# Patient Record
Sex: Male | Born: 2000 | Hispanic: No | Marital: Single
Health system: Southern US, Community
[De-identification: ages and names within clinical notes are randomized; demographics above are authoritative.]

---
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
## 2019-08-16 IMAGING — CR DG ELBOW COMPLETE 3+V*L*
4 series · 4 of 4 positions shown · non-contrast
Comparison: None.

CLINICAL DATA: Skateboard injury with laceration.

EXAM:
LEFT ELBOW - COMPLETE 3+ VIEW

[x elbow ap left]
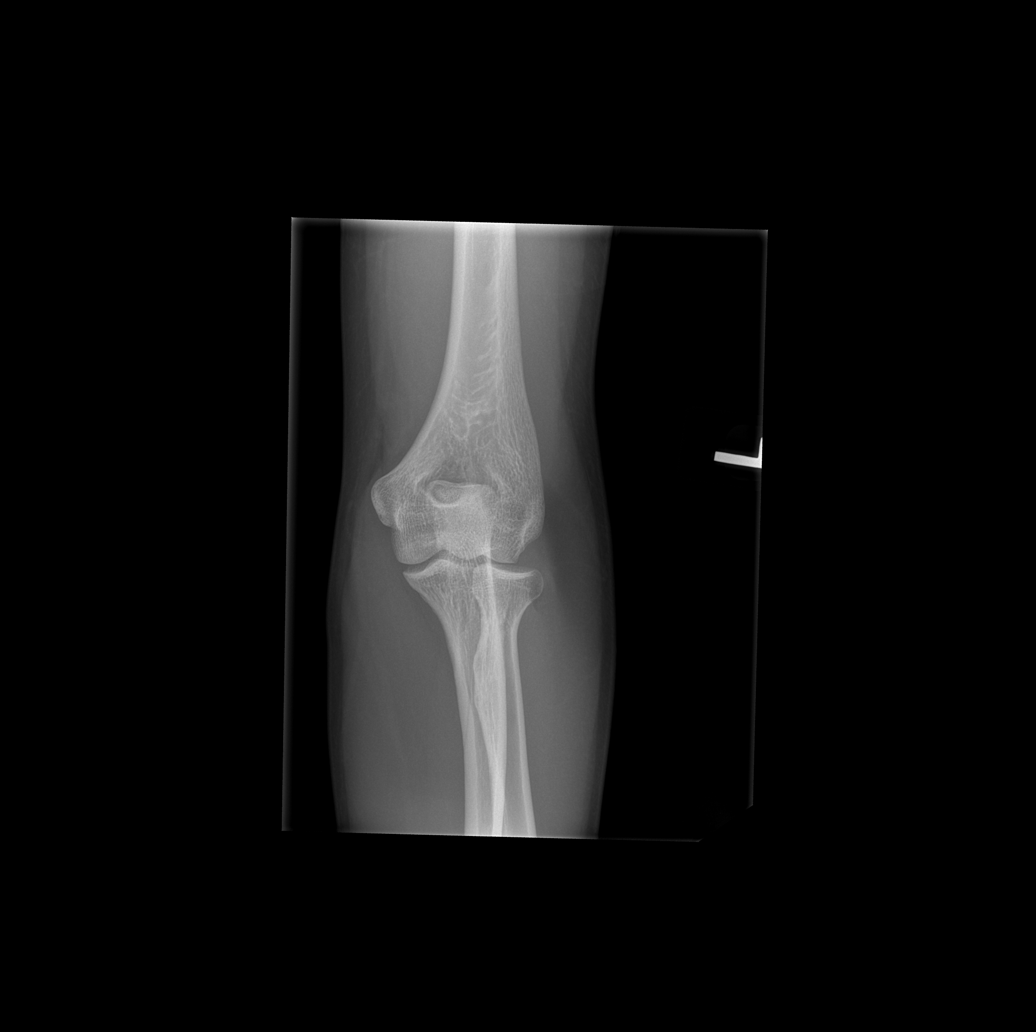

[x elbow obl left (1 of 2)]
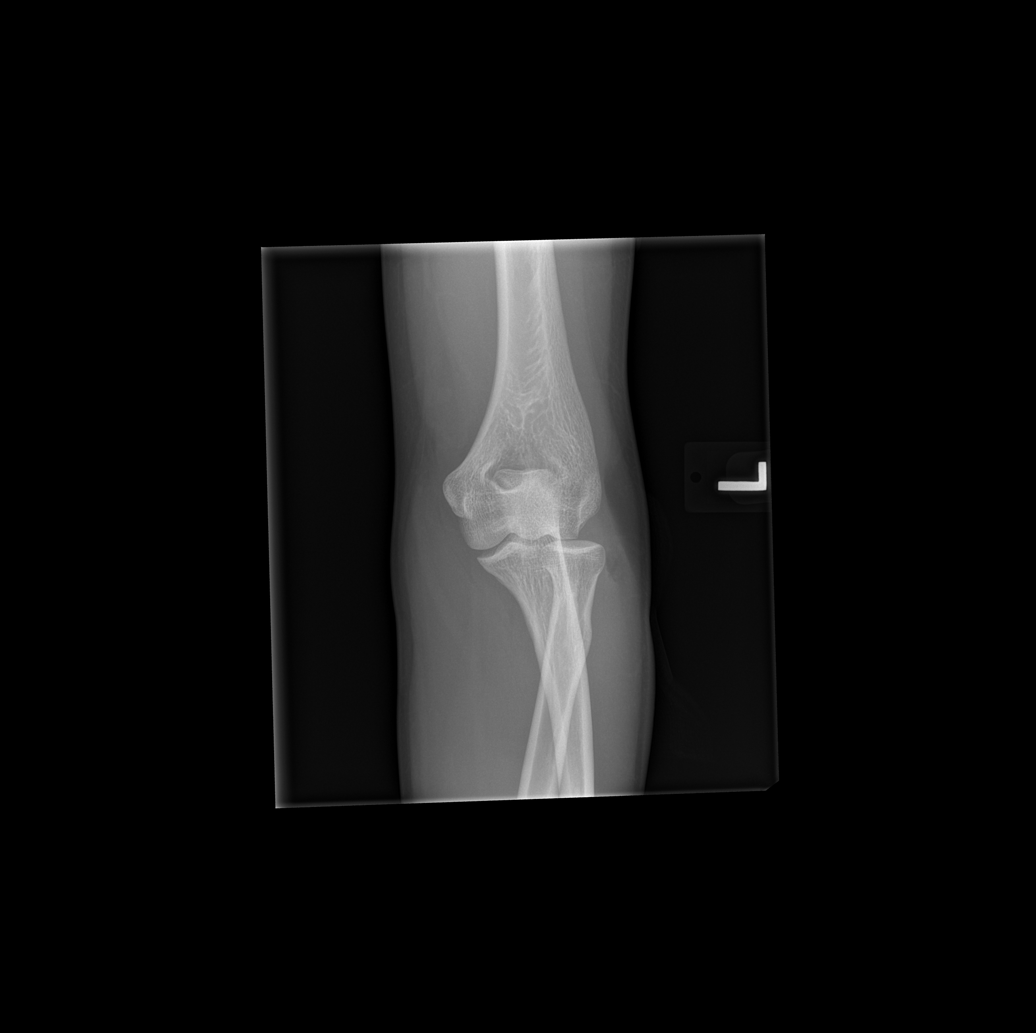

[x elbow obl left (2 of 2)]
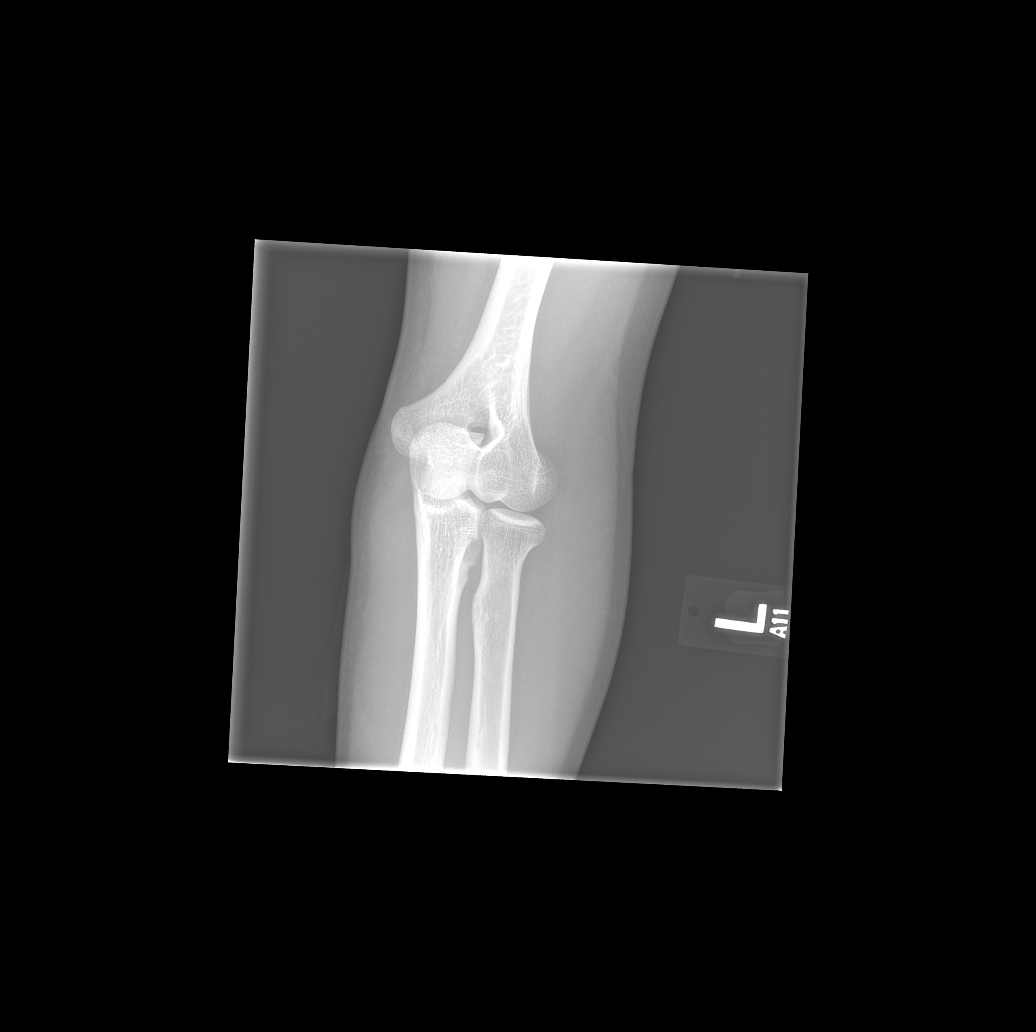

[x elbow lat left]
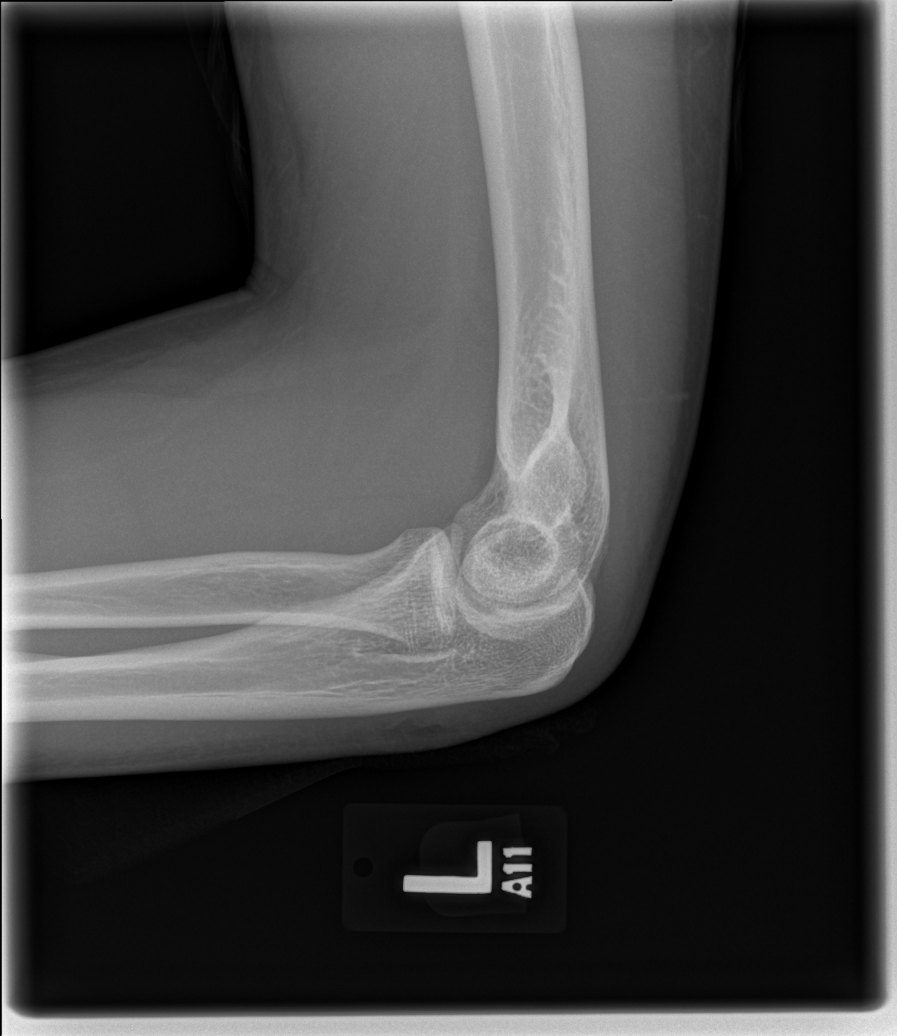

[4 of 4 positions shown; findings below may reference images not displayed]

FINDINGS: There is no evidence of fracture, dislocation, or joint effusion.
There is no evidence of arthropathy or other focal bone abnormality.
Soft tissues show a laceration over the ulnar olecranon.
IMPRESSION: Soft tissue injury over the ulnar olecranon. No evidence of
fracture, joint effusion, foreign object or air in the joint.
